# Patient Record
Sex: Male | Born: 1940 | Race: White | Hispanic: No | Marital: Married | Smoking: Never smoker
Health system: Southern US, Community
[De-identification: ages and names within clinical notes are randomized; demographics above are authoritative.]

## PROBLEM LIST (undated history)

## (undated) DIAGNOSIS — I2510 Atherosclerotic heart disease of native coronary artery without angina pectoris: Secondary | ICD-10-CM

## (undated) DIAGNOSIS — Z955 Presence of coronary angioplasty implant and graft: Secondary | ICD-10-CM

## (undated) DIAGNOSIS — I10 Essential (primary) hypertension: Secondary | ICD-10-CM

## (undated) DIAGNOSIS — K5792 Diverticulitis of intestine, part unspecified, without perforation or abscess without bleeding: Secondary | ICD-10-CM

## (undated) HISTORY — PX: ABDOMINAL SURGERY: SHX537

## (undated) HISTORY — PX: JOINT REPLACEMENT: SHX530

## (undated) HISTORY — PX: CHOLECYSTECTOMY: SHX55

## (undated) HISTORY — PX: COLON RESECTION: SHX5231

---

## 2014-04-02 ENCOUNTER — Observation Stay (HOSPITAL_COMMUNITY)
Admission: EM | Admit: 2014-04-02 | Discharge: 2014-04-03 | Disposition: A | Discharge status: Home / Self Care | Payer: Medicare (Managed Care) | Attending: Internal Medicine | Admitting: Internal Medicine

## 2014-04-02 ENCOUNTER — Emergency Department (HOSPITAL_COMMUNITY): Payer: Medicare (Managed Care)

## 2014-04-02 ENCOUNTER — Encounter (HOSPITAL_COMMUNITY): Payer: Self-pay | Admitting: *Deleted

## 2014-04-02 DIAGNOSIS — R079 Chest pain, unspecified: Secondary | ICD-10-CM | POA: Diagnosis present

## 2014-04-02 DIAGNOSIS — R410 Disorientation, unspecified: Secondary | ICD-10-CM | POA: Diagnosis not present

## 2014-04-02 DIAGNOSIS — Z79899 Other long term (current) drug therapy: Secondary | ICD-10-CM | POA: Insufficient documentation

## 2014-04-02 DIAGNOSIS — Z7982 Long term (current) use of aspirin: Secondary | ICD-10-CM | POA: Diagnosis not present

## 2014-04-02 DIAGNOSIS — I2510 Atherosclerotic heart disease of native coronary artery without angina pectoris: Secondary | ICD-10-CM | POA: Insufficient documentation

## 2014-04-02 DIAGNOSIS — Z880 Allergy status to penicillin: Secondary | ICD-10-CM | POA: Insufficient documentation

## 2014-04-02 DIAGNOSIS — Z8719 Personal history of other diseases of the digestive system: Secondary | ICD-10-CM | POA: Diagnosis not present

## 2014-04-02 DIAGNOSIS — I10 Essential (primary) hypertension: Principal | ICD-10-CM | POA: Diagnosis present

## 2014-04-02 DIAGNOSIS — R0789 Other chest pain: Secondary | ICD-10-CM

## 2014-04-02 DIAGNOSIS — Z9861 Coronary angioplasty status: Secondary | ICD-10-CM | POA: Diagnosis not present

## 2014-04-02 HISTORY — DX: Atherosclerotic heart disease of native coronary artery without angina pectoris: I25.10

## 2014-04-02 HISTORY — DX: Presence of coronary angioplasty implant and graft: Z95.5

## 2014-04-02 HISTORY — DX: Diverticulitis of intestine, part unspecified, without perforation or abscess without bleeding: K57.92

## 2014-04-02 HISTORY — DX: Essential (primary) hypertension: I10

## 2014-04-02 LAB — CBC WITH DIFFERENTIAL/PLATELET
BASOS PCT: 0 % (ref 0–1)
Basophils Absolute: 0 10*3/uL (ref 0.0–0.1)
EOS ABS: 0.1 10*3/uL (ref 0.0–0.7)
EOS PCT: 2 % (ref 0–5)
HCT: 46.1 % (ref 39.0–52.0)
HEMOGLOBIN: 15.8 g/dL (ref 13.0–17.0)
Lymphocytes Relative: 23 % (ref 12–46)
Lymphs Abs: 1.2 10*3/uL (ref 0.7–4.0)
MCH: 30.8 pg (ref 26.0–34.0)
MCHC: 34.3 g/dL (ref 30.0–36.0)
MCV: 89.9 fL (ref 78.0–100.0)
MONOS PCT: 12 % (ref 3–12)
Monocytes Absolute: 0.6 10*3/uL (ref 0.1–1.0)
Neutro Abs: 3.3 10*3/uL (ref 1.7–7.7)
Neutrophils Relative %: 63 % (ref 43–77)
Platelets: 152 10*3/uL (ref 150–400)
RBC: 5.13 MIL/uL (ref 4.22–5.81)
RDW: 13 % (ref 11.5–15.5)
WBC: 5.2 10*3/uL (ref 4.0–10.5)

## 2014-04-02 LAB — COMPREHENSIVE METABOLIC PANEL
ALBUMIN: 4 g/dL (ref 3.5–5.2)
ALK PHOS: 62 U/L (ref 39–117)
ALT: 20 U/L (ref 0–53)
AST: 20 U/L (ref 0–37)
Anion gap: 5 (ref 5–15)
BUN: 11 mg/dL (ref 6–23)
CALCIUM: 8.9 mg/dL (ref 8.4–10.5)
CO2: 30 mmol/L (ref 19–32)
Chloride: 103 mmol/L (ref 96–112)
Creatinine, Ser: 0.85 mg/dL (ref 0.50–1.35)
GFR calc Af Amer: >90 mL/min (ref >90)
GFR calc non Af Amer: 84 mL/min — ABNORMAL LOW (ref >90)
Glucose, Bld: 86 mg/dL (ref 70–99)
POTASSIUM: 3.9 mmol/L (ref 3.5–5.1)
Sodium: 138 mmol/L (ref 135–145)
Total Bilirubin: 1.1 mg/dL (ref 0.3–1.2)
Total Protein: 6.9 g/dL (ref 6.0–8.3)

## 2014-04-02 LAB — TROPONIN I

## 2014-04-02 MED ORDER — ATENOLOL 25 MG PO TABS: 25.0000 mg | ORAL_TABLET | Freq: Every day | ORAL | Status: DC

## 2014-04-02 MED ORDER — LOSARTAN POTASSIUM 50 MG PO TABS
50.0000 mg | ORAL_TABLET | Freq: Every day | ORAL | Status: DC
  Administered 2014-04-03: 50 mg via ORAL
  Filled 2014-04-02: qty 1

## 2014-04-02 MED ORDER — PRAVASTATIN SODIUM 40 MG PO TABS: 40.0000 mg | ORAL_TABLET | Freq: Every evening | ORAL | Status: DC

## 2014-04-02 MED ORDER — ATENOLOL 50 MG PO TABS: 50.0000 mg | ORAL_TABLET | Freq: Every day | ORAL | Status: DC

## 2014-04-02 MED ORDER — LOSARTAN POTASSIUM 25 MG PO TABS: 25.0000 mg | ORAL_TABLET | Freq: Every day | ORAL | Status: DC

## 2014-04-02 MED ORDER — ASPIRIN EC 81 MG PO TBEC
81.0000 mg | DELAYED_RELEASE_TABLET | ORAL | Status: DC
  Administered 2014-04-03: 81 mg via ORAL
  Filled 2014-04-02: qty 1

## 2014-04-02 MED ORDER — LOSARTAN POTASSIUM 50 MG PO TABS: 50.0000 mg | ORAL_TABLET | Freq: Every day | ORAL | Status: DC

## 2014-04-02 MED ORDER — NITROGLYCERIN 0.4 MG SL SUBL
0.4000 mg | SUBLINGUAL_TABLET | Freq: Once | SUBLINGUAL | Status: AC
  Administered 2014-04-02: 0.4 mg via SUBLINGUAL
  Filled 2014-04-02: qty 1

## 2014-04-02 MED ORDER — ATENOLOL 25 MG PO TABS
50.0000 mg | ORAL_TABLET | Freq: Every day | ORAL | Status: DC
  Administered 2014-04-03: 50 mg via ORAL
  Filled 2014-04-02: qty 1

## 2014-04-02 NOTE — ED Notes (Signed)
Admitting MD at BS.  

## 2014-04-02 NOTE — ED Notes (Signed)
MD at bedside. 

## 2014-04-02 NOTE — ED Notes (Signed)
Pt in c/o hypertension for the last two weeks, states he was seen at a hospital in [place] for this and his medication was adjusted, pt here for further evaluation

## 2014-04-02 NOTE — ED Provider Notes (Signed)
CSN: [id number]     Arrival date & time 04/02/14  1810 History   First MD Initiated Contact with Patient 04/02/14 2018     Chief Complaint  Patient presents with  . Hypertension     (Consider location/radiation/quality/duration/timing/severity/associated sxs/prior Treatment) Patient is a 74 y.o. male presenting with hypertension. The history is provided by the patient.  Hypertension This is a recurrent problem. Associated symptoms include chest pain. Pertinent negatives include no abdominal pain, no headaches and no shortness of breath.   patient presents for hypertension. Was seen earlier in [place] for the same. They adjusted his valsartan. States pressures are running high. States he feels kind of bad and maybe a little confused with it. States he does have a feeling like reflux and pressure in his chest. He has had a previous coronary artery stent. He does have a history of hypertension. No lightheadedness or dizziness. No headache. No confusion.  Past Medical History  Diagnosis Date  . Hypertension   . Coronary artery disease   . S/P coronary artery stent placement   . Diverticulitis    Past Surgical History  Procedure Laterality Date  . Joint replacement    . Abdominal surgery    . Colon resection    . Cholecystectomy     History reviewed. No pertinent family history. History  Substance Use Topics  . Smoking status: Never Smoker   . Smokeless tobacco: Not on file  . Alcohol Use: Not on file    Review of Systems  Constitutional: Negative for activity change and appetite change.  Eyes: Negative for pain.  Respiratory: Negative for chest tightness and shortness of breath.   Cardiovascular: Positive for chest pain. Negative for leg swelling.  Gastrointestinal: Negative for nausea, vomiting, abdominal pain and diarrhea.  Genitourinary: Negative for flank pain.  Musculoskeletal: Negative for back pain and neck stiffness.  Skin: Negative for rash.  Neurological: Negative  for weakness, numbness and headaches.  Psychiatric/Behavioral: Positive for confusion. Negative for behavioral problems.      Allergies  Eggs or egg-derived products and Penicillins  Home Medications   Prior to Admission medications   Medication Sig Start Date End Date Taking? Authorizing Provider  aspirin EC 81 MG tablet Take 81 mg by mouth every morning.   Yes Historical Provider, MD  atenolol (TENORMIN) 50 MG tablet Take 50 mg by mouth daily.   Yes Historical Provider, MD  EPINEPHrine 0.3 mg/0.3 mL IJ SOAJ injection Inject 0.3 mg into the muscle daily as needed (anaphylaxis).   Yes Historical Provider, MD  Flaxseed, Linseed, (FLAX SEED OIL) 1300 MG CAPS Take 1,300 mg by mouth 2 (two) times daily.   Yes Historical Provider, MD  hydrochlorothiazide (HYDRODIURIL) 25 MG tablet Take 25 mg by mouth daily as needed (fluid).   Yes Historical Provider, MD  ketotifen (ZADITOR) 0.025 % ophthalmic solution Place 1-2 drops into both eyes daily as needed (allergies).   Yes Historical Provider, MD  losartan (COZAAR) 50 MG tablet Take 50 mg by mouth daily.   Yes Historical Provider, MD  Melatonin 3 MG TABS Take 3 mg by mouth at bedtime as needed (sleep).   Yes Historical Provider, MD  Multiple Vitamin (MULTIVITAMIN WITH MINERALS) TABS tablet Take 0.5 tablets by mouth 2 (two) times daily.   Yes Historical Provider, MD  pravastatin (PRAVACHOL) 40 MG tablet Take 40 mg by mouth every evening.   Yes Historical Provider, MD   BP 144/77 mmHg  Pulse 57  Temp(Src) 98.2 F (36.8 C) (Oral)  Resp 19  Ht 5\' 10"  (1.778 m)  Wt 194 lb 4 oz (88.111 kg)  BMI 27.87 kg/m2  SpO2 97% Physical Exam  Constitutional: He appears well-developed.  HENT:  Head: Normocephalic.  Neck: Neck supple.  Cardiovascular: Normal rate.   Pulmonary/Chest: Effort normal.  Abdominal: Soft. There is no tenderness.  Musculoskeletal: Normal range of motion.  Neurological: He is alert.  Skin: Skin is warm.    ED Course   Procedures (including critical care time) Labs Review Labs Reviewed  COMPREHENSIVE METABOLIC PANEL - Abnormal; Notable for the following:    GFR calc non Af Amer 84 (*)    All other components within normal limits  CBC WITH DIFFERENTIAL/PLATELET  TROPONIN I    Imaging Review Dg Chest 2 View  04/02/2014   CLINICAL DATA:  Hypertension and chest pain  EXAM: CHEST  2 VIEW  COMPARISON:  None.  FINDINGS: Cardiac shadow is within normal limits. The lungs are well aerated bilaterally. A vague density is noted in the right lung base best seen on the frontal film. It is not well appreciated on the lateral projection. This may be related confluence of vascular parenchymal shadows or nipple shadow although the possibility of a small nodule cannot be totally excluded. No bony abnormality is noted.  IMPRESSION: Questionable nodular density in the right lung base. This may be artifactual in nature. Short-term followup with nipple markers is recommended   Electronically Signed   By: Alcide CleverMark  Lukens M.D.   On: 04/02/2014 21:57     EKG Interpretation   Date/Time:  Thursday April 02 2014 [id number]:10:[id number]:28:24 EST Ventricular Rate:  56 PR Interval:  69 QRS Duration: 94 QT Interval:  438 QTC Calculation: 423 R Axis:   65 Text Interpretation:  Sinus rhythm Short PR interval Low voltage,  precordial leads Confirmed by Aida Lemaire  MD, Harrold DonathNATHAN [phone]([id number]) on 04/02/2014  8:33:30 PM      MDM   Final diagnoses:  Chest pain, unspecified chest pain type  Essential hypertension    Patient with some chest pressure and hypertension. Both resolved after sublingual nitroglycerin. Has been seen recently for same. Has previous LAD stent. With risk factors of the previous stent and hypertension causing potential pai. Will admit for arms.Juliet Rude,    Alanson Hausmann R. Rubin PayorPickering, MD 04/02/14 [phone]

## 2014-04-02 NOTE — ED Notes (Signed)
Pt requesting to take his home BP medications, consulted with EDP, hold at this time. Pt informed.

## 2014-04-03 ENCOUNTER — Observation Stay (HOSPITAL_COMMUNITY): Payer: Medicare (Managed Care)

## 2014-04-03 DIAGNOSIS — I10 Essential (primary) hypertension: Principal | ICD-10-CM

## 2014-04-03 DIAGNOSIS — R079 Chest pain, unspecified: Secondary | ICD-10-CM

## 2014-04-03 LAB — TROPONIN I
Troponin I: <0.03 ng/mL (ref <0.031)
Troponin I: <0.03 ng/mL (ref <0.031)
Troponin I: <0.03 ng/mL (ref <0.031)

## 2014-04-03 MED ORDER — REGADENOSON 0.4 MG/5ML IV SOLN
0.4000 mg | Freq: Once | INTRAVENOUS | Status: AC
  Administered 2014-04-03: 0.4 mg via INTRAVENOUS

## 2014-04-03 MED ORDER — REGADENOSON 0.4 MG/5ML IV SOLN
INTRAVENOUS | Status: AC
  Filled 2014-04-03: qty 5

## 2014-04-03 MED ORDER — TECHNETIUM TC 99M SESTAMIBI GENERIC - CARDIOLITE
30.0000 | Freq: Once | INTRAVENOUS | Status: AC | PRN
  Administered 2014-04-03: 30 via INTRAVENOUS

## 2014-04-03 MED ORDER — HEPARIN SODIUM (PORCINE) 5000 UNIT/ML IJ SOLN
5000.0000 [IU] | Freq: Three times a day (TID) | INTRAMUSCULAR | Status: DC
  Administered 2014-04-03 (×3): 5000 [IU] via SUBCUTANEOUS
  Filled 2014-04-03 (×3): qty 1

## 2014-04-03 MED ORDER — ONDANSETRON HCL 4 MG/2ML IJ SOLN: 4.0000 mg | Freq: Four times a day (QID) | INTRAMUSCULAR | Status: DC | PRN

## 2014-04-03 MED ORDER — TECHNETIUM TC 99M SESTAMIBI GENERIC - CARDIOLITE
10.0000 | Freq: Once | INTRAVENOUS | Status: AC | PRN
  Administered 2014-04-03: 10 via INTRAVENOUS

## 2014-04-03 MED ORDER — ACETAMINOPHEN 325 MG PO TABS
650.0000 mg | ORAL_TABLET | ORAL | Status: DC | PRN
Start: 2014-04-03 — End: 2014-04-03

## 2014-04-03 NOTE — Consult Note (Signed)
CARDIOLOGY CONSULT NOTE   Patient ID: Michiah Mudry MRN: [id number], DOB/AGE: 03-09-1940   Admit date: 04/02/2014 Date of Consult: 04/03/2014   Primary Physician: No primary care provider on file. Primary Cardiologist: None in [facility]  Pt. Profile  74 year old gentleman with known ischemic heart disease.  Admitted with chest pain  Problem List  Past Medical History  Diagnosis Date  . Hypertension   . Coronary artery disease   . S/P coronary artery stent placement   . Diverticulitis     Past Surgical History  Procedure Laterality Date  . Joint replacement    . Abdominal surgery    . Colon resection    . Cholecystectomy       Allergies  Allergies  Allergen Reactions  . Eggs Or Egg-Derived Products Anaphylaxis    EGG WHITE   . Penicillins Anaphylaxis    HPI   This 74 year old gentleman lives in [place].  He was recently vacationing in [place] and was on his way back home.  He has a son who lives in [place].  He has a history of ischemic heart disease.  2 years ago he had chest pain and had an abnormal Myoview stress test which led to a heart catheterization in [place].  He has a drug-eluting stent to his distal LAD by history.  He was treated with dual antiplatelet therapy with aspirin and Brilinta for one year and now is on aspirin alone.  He has a history of high blood pressure.  Last Sunday his blood pressure was running high and he was having a headache and went to a emergency room in [place] where he was evaluated and released.  He had been on losartan 25 mg daily and he was advised to increase his dose to 50 mg a day.  Yesterday he felt some left-sided chest pressure and felt that his blood pressure was again running high.  He came to [facility] emergency room where he was evaluated.  His initial blood pressure was normal.  His initial EKG showed no ischemic changes.  Cardiac enzymes are negative.  He is having no further chest discomfort. The patient is not  diabetic. The patient is a non smoker Family history reveals that his father died a week after open heart surgery for CABG in his 34s.  His mother died of an head injury following a fall. Inpatient Medications  . aspirin EC  81 mg Oral [id number]  . atenolol  50 mg Oral QHS  . heparin  5,000 Units Subcutaneous 3 times per day  . losartan  50 mg Oral QHS  . pravastatin  40 mg Oral QPM    Family History History reviewed.  Father had CABG in his 39s.  Social History History   Social History  . Marital Status: Married    Spouse Name: N/A  . Number of Children: N/A  . Years of Education: N/A   Occupational History  . Not on file.   Social History Main Topics  . Smoking status: Never Smoker   . Smokeless tobacco: Not on file  . Alcohol Use: Not on file  . Drug Use: Not on file  . Sexual Activity: Not on file   Other Topics Concern  . Not on file   Social History Narrative  . No narrative on file     Review of Systems  General:  No chills, fever, night sweats or weight changes.  Cardiovascular:  No chest pain, dyspnea on exertion, edema, orthopnea, palpitations, paroxysmal nocturnal  dyspnea. Dermatological: No rash, lesions/masses Respiratory: No cough, dyspnea Urologic: No hematuria, dysuria Abdominal:   No nausea, vomiting, diarrhea, bright red blood per rectum, melena, or hematemesis Neurologic:  No visual changes, wkns, changes in mental status. All other systems reviewed and are otherwise negative except as noted above.  Physical Exam  Blood pressure 129/67, pulse 51, temperature 97.8 F (36.6 C), temperature source Oral, resp. rate 18, height 5\' 10"  (1.778 m), weight 192 lb 12.8 oz (87.454 kg), SpO2 96 %.  General: Pleasant, NAD Psych: Normal affect. Neuro: Alert and oriented X 3. Moves all extremities spontaneously. HEENT: Normal  Neck: Supple without bruits or JVD. Lungs:  Resp regular and unlabored, CTA. Heart: RRR no s3, s4, or murmurs. Abdomen: Soft,  non-tender, non-distended, BS + x 4.  Extremities: No clubbing, cyanosis or edema. DP/PT/Radials 2+ and equal bilaterally.  Labs   Recent Labs  04/02/14 2041 04/03/14 0148 04/03/14 0509  TROPONINI <0.03 <0.03 <0.03   Lab Results  Component Value Date   WBC 5.2 04/02/2014   HGB 15.8 04/02/2014   HCT 46.1 04/02/2014   MCV 89.9 04/02/2014   PLT 152 04/02/2014     Recent Labs Lab 04/02/14 2041  NA 138  K 3.9  CL 103  CO2 30  BUN 11  CREATININE 0.85  CALCIUM 8.9  PROT 6.9  BILITOT 1.1  ALKPHOS 62  ALT 20  AST 20  GLUCOSE 86   No results found for: CHOL, HDL, LDLCALC, TRIG No results found for: DDIMER  Radiology/Studies  Dg Chest 2 View  04/02/2014   CLINICAL DATA:  Hypertension and chest pain  EXAM: CHEST  2 VIEW  COMPARISON:  None.  FINDINGS: Cardiac shadow is within normal limits. The lungs are well aerated bilaterally. A vague density is noted in the right lung base best seen on the frontal film. It is not well appreciated on the lateral projection. This may be related confluence of vascular parenchymal shadows or nipple shadow although the possibility of a small nodule cannot be totally excluded. No bony abnormality is noted.  IMPRESSION: Questionable nodular density in the right lung base. This may be artifactual in nature. Short-term followup with nipple markers is recommended   Electronically Signed   By: Alcide CleverMark  Lukens M.D.   On: 04/02/2014 21:57    ECG  Normal sinus rhythm.  No ischemic changes.  Personally reviewed.  ASSESSMENT AND PLAN  1.  Chest pain.  Prior history of stent to LAD 2 years ago. 2.  Essential hypertension without heart failure 3.  History of hypercholesterolemia  Disposition: We will arrange for treadmill Myoview stress test today.  If stress test is negative he should be able to be discharged later today.  Continue on home medications including beta blocker, statin therapy, losartan, and aspirin at discharge.   Signed, Cassell Clementhomas  Amika Tassin, MD  04/03/2014, 8:10 AM

## 2014-04-03 NOTE — Progress Notes (Signed)
UR completed 

## 2014-04-03 NOTE — H&P (Signed)
[facility] History and Physical  Anoop Hemmer EXB:[id number] DOB: Jun 01, 1940 DOA: 04/02/2014  Referring physician: EDP PCP: No primary care provider on file.   Chief Complaint: Chest pain   HPI: Jerry Osborne is a 74 y.o. male who presents to the ED with c/o uncontrollable HTN.  Patient has been in [place] for the past month, his BPs have been running high over the past week or so.  Was seen in [place], had is ARB dosage increased.  He does have a feeling of pressure and "like reflux" in his chest as well.  He has a history of CAD with stent to LAD 2 years ago.  Last stress test was at that time.  Review of Systems: Systems reviewed.  As above, otherwise negative  Past Medical History  Diagnosis Date  . Hypertension   . Coronary artery disease   . S/P coronary artery stent placement   . Diverticulitis    Past Surgical History  Procedure Laterality Date  . Joint replacement    . Abdominal surgery    . Colon resection    . Cholecystectomy     Social History:  reports that he has never smoked. He does not have any smokeless tobacco history on file. His alcohol and drug histories are not on file.  Allergies  Allergen Reactions  . Eggs Or Egg-Derived Products Anaphylaxis    EGG WHITE   . Penicillins Anaphylaxis    History reviewed. No pertinent family history.   Prior to Admission medications   Medication Sig Start Date End Date Taking? Authorizing Provider  aspirin EC 81 MG tablet Take 81 mg by mouth every morning.   Yes Historical Provider, MD  atenolol (TENORMIN) 50 MG tablet Take 50 mg by mouth daily.   Yes Historical Provider, MD  EPINEPHrine 0.3 mg/0.3 mL IJ SOAJ injection Inject 0.3 mg into the muscle daily as needed (anaphylaxis).   Yes Historical Provider, MD  Flaxseed, Linseed, (FLAX SEED OIL) 1300 MG CAPS Take 1,300 mg by mouth 2 (two) times daily.   Yes Historical Provider, MD  hydrochlorothiazide (HYDRODIURIL) 25 MG tablet Take 25 mg by mouth daily as  needed (fluid).   Yes Historical Provider, MD  ketotifen (ZADITOR) 0.025 % ophthalmic solution Place 1-2 drops into both eyes daily as needed (allergies).   Yes Historical Provider, MD  losartan (COZAAR) 50 MG tablet Take 50 mg by mouth daily.   Yes Historical Provider, MD  Melatonin 3 MG TABS Take 3 mg by mouth at bedtime as needed (sleep).   Yes Historical Provider, MD  Multiple Vitamin (MULTIVITAMIN WITH MINERALS) TABS tablet Take 0.5 tablets by mouth 2 (two) times daily.   Yes Historical Provider, MD  pravastatin (PRAVACHOL) 40 MG tablet Take 40 mg by mouth every evening.   Yes Historical Provider, MD   Physical Exam: Filed Vitals:   04/03/14 0015  BP: 137/66  Pulse: 58  Temp:   Resp: 17    BP 137/66 mmHg  Pulse 58  Temp(Src) 98.2 F (36.8 C) (Oral)  Resp 17  Ht  (1.778 m)  Wt 88.111 kg (194 lb 4 oz)  BMI 27.87 kg/m2  SpO2 96%  General Appearance:    Alert, oriented, no distress, appears stated age  Head:    Normocephalic, atraumatic  Eyes:    PERRL, EOMI, sclera non-icteric        Nose:   Nares without drainage or epistaxis. Mucosa, turbinates normal  Throat:   Moist mucous membranes. Oropharynx without erythema or  exudate.  Neck:   Supple. No carotid bruits.  No thyromegaly.  No lymphadenopathy.   Back:     No CVA tenderness, no spinal tenderness  Lungs:     Clear to auscultation bilaterally, without wheezes, rhonchi or rales  Chest wall:    No tenderness to palpitation  Heart:    Regular rate and rhythm without murmurs, gallops, rubs  Abdomen:     Soft, non-tender, nondistended, normal bowel sounds, no organomegaly  Genitalia:    deferred  Rectal:    deferred  Extremities:   No clubbing, cyanosis or edema.  Pulses:   2+ and symmetric all extremities  Skin:   Skin color, texture, turgor normal, no rashes or lesions  Lymph nodes:   Cervical, supraclavicular, and axillary nodes normal  Neurologic:   CNII-XII intact. Normal strength, sensation and reflexes       throughout    Labs on Admission:  Basic Metabolic Panel:  Recent Labs Lab 04/02/14 2041  NA 138  K 3.9  CL 103  CO2 30  GLUCOSE 86  BUN 11  CREATININE 0.85  CALCIUM 8.9   Liver Function Tests:  Recent Labs Lab 04/02/14 2041  AST 20  ALT 20  ALKPHOS 62  BILITOT 1.1  PROT 6.9  ALBUMIN 4.0   No results for input(s): LIPASE, AMYLASE in the last 168 hours. No results for input(s): AMMONIA in the last 168 hours. CBC:  Recent Labs Lab 04/02/14 2041  WBC 5.2  NEUTROABS 3.3  HGB 15.8  HCT 46.1  MCV 89.9  PLT 152   Cardiac Enzymes:  Recent Labs Lab 04/02/14 2041  TROPONINI <0.03    BNP (last 3 results) No results for input(s): PROBNP in the last 8760 hours. CBG: No results for input(s): GLUCAP in the last 168 hours.  Radiological Exams on Admission: Dg Chest 2 View  04/02/2014   CLINICAL DATA:  Hypertension and chest pain  EXAM: CHEST  2 VIEW  COMPARISON:  None.  FINDINGS: Cardiac shadow is within normal limits. The lungs are well aerated bilaterally. A vague density is noted in the right lung base best seen on the frontal film. It is not well appreciated on the lateral projection. This may be related confluence of vascular parenchymal shadows or nipple shadow although the possibility of a small nodule cannot be totally excluded. No bony abnormality is noted.  IMPRESSION: Questionable nodular density in the right lung base. This may be artifactual in nature. Short-term followup with nipple markers is recommended   Electronically Signed   By: Alcide CleverMark  Lukens M.D.   On: 04/02/2014 21:57    EKG: Independently reviewed.  Assessment/Plan Principal Problem:   Chest pain Active Problems:   HTN (hypertension)   1. Chest pain - HEART score = 4 with 2 points for age and 2 points for history of CAD and stenting. 1. Chest pain obs 2. Serial troponins 3. Tele monitor 4. NPO after midnight 2. HTN - continue home meds, BP may elevate tonight because he took half doses  of home meds earlier today for HTN but took none of his night doses, in this case will use hydralazine PRN.    Code Status: Full Code  Family Communication: Wife at bedside Disposition Plan: Admit to obs   Time spent: 70 min  Walterine Amodei M. [facility] Pager [phone]  If 7AM-7PM, please contact the day team taking care of the patient [email or web address] Password [email or web address] 04/03/2014, 12:18 AM

## 2014-04-03 NOTE — ED Notes (Signed)
Attempted to give report 

## 2014-04-03 NOTE — Progress Notes (Signed)
Patient unable to reach target HR of 124 despite walking on treadmill for 9:7518min and went into 4th stage of Bruce Protocol. He became too fatigued to walk further. Test converted to lexiscan stress test  Lexiscan stress test completed without complication. [facility]  Signed, Azalee CourseHao Anjeanette Petzold PA Pager: [phone]

## 2014-04-03 NOTE — Discharge Summary (Signed)
Physician Discharge Summary  Jerry Osborne:[id number] DOB: 29-Jun-1940 DOA: 04/02/2014  PCP: No primary care provider on file.  Admit date: 04/02/2014 Discharge date: 04/03/2014  Time spent: 40 minutes  Recommendations for Outpatient Follow-up:  1. Follow-up with primary care physician within one week  Discharge Diagnoses:  Principal Problem:   Chest pain Active Problems:   HTN (hypertension)   Discharge Condition: Stable  Diet recommendation: Heart Healthy  Filed Weights   04/02/14 1830 04/03/14 0053  Weight: 88.111 kg (194 lb 4 oz) 87.454 kg (192 lb 12.8 oz)    History of present illness:  Jerry Osborne is a 74 y.o. male who presents to the ED with c/o uncontrollable HTN. Patient has been in [place] for the past month, his BPs have been running high over the past week or so. Was seen in [place], had is ARB dosage increased. He does have a feeling of pressure and "like reflux" in his chest as well. He has a history of CAD with stent to LAD 2 years ago. Last stress test was at that time.  Hospital Course:   Chest pain Patient presented to the hospital with pressure-like chest pain. Recently had his hypertension medications change. Cardiology consulted, 3 sets of cardiac enzymes and 12-lead EKG negative for ischemic changes. Exercise stress test was done, patient heart rate did not go up to the target rate so the test switched to [facility] test. Lexiscan as negative for ischemia. Patient discharged home to follow-up with primary care physician. The chest pain is likely gastrointestinal in origin  Essential hypertension Blood pressures controlled, continue home medications.   Procedures:  Lexiscan stress test, showed no evidence of induced ischemia.  Consultations:  Cardiology  Discharge Exam: Filed Vitals:   04/03/14 1216  BP: 147/72  Pulse:   Temp:   Resp:    General: Alert and awake, oriented x3, not in any acute distress. HEENT: anicteric  sclera, pupils reactive to light and accommodation, EOMI CVS: S1-S2 clear, no murmur rubs or gallops Chest: clear to auscultation bilaterally, no wheezing, rales or rhonchi Abdomen: soft nontender, nondistended, normal bowel sounds, no organomegaly Extremities: no cyanosis, clubbing or edema noted bilaterally Neuro: Cranial nerves II-XII intact, no focal neurological deficits  Discharge Instructions   Discharge Instructions    Diet - low sodium heart healthy    Complete by:  As directed      Increase activity slowly    Complete by:  As directed           Current Discharge Medication List    CONTINUE these medications which have NOT CHANGED   Details  aspirin EC 81 MG tablet Take 81 mg by mouth every morning.    atenolol (TENORMIN) 50 MG tablet Take 50 mg by mouth daily.    EPINEPHrine 0.3 mg/0.3 mL IJ SOAJ injection Inject 0.3 mg into the muscle daily as needed (anaphylaxis).    Flaxseed, Linseed, (FLAX SEED OIL) 1300 MG CAPS Take 1,300 mg by mouth 2 (two) times daily.    hydrochlorothiazide (HYDRODIURIL) 25 MG tablet Take 25 mg by mouth daily as needed (fluid).    ketotifen (ZADITOR) 0.025 % ophthalmic solution Place 1-2 drops into both eyes daily as needed (allergies).    losartan (COZAAR) 50 MG tablet Take 50 mg by mouth daily.    Melatonin 3 MG TABS Take 3 mg by mouth at bedtime as needed (sleep).    Multiple Vitamin (MULTIVITAMIN WITH MINERALS) TABS tablet Take 0.5 tablets by mouth 2 (two) times  daily.    pravastatin (PRAVACHOL) 40 MG tablet Take 40 mg by mouth every evening.       Allergies  Allergen Reactions  . Eggs Or Egg-Derived Products Anaphylaxis    EGG WHITE   . Penicillins Anaphylaxis      The results of significant diagnostics from this hospitalization (including imaging, microbiology, ancillary and laboratory) are listed below for reference.    Significant Diagnostic Studies: Dg Chest 2 View  04/02/2014   CLINICAL DATA:  Hypertension and chest  pain  EXAM: CHEST  2 VIEW  COMPARISON:  None.  FINDINGS: Cardiac shadow is within normal limits. The lungs are well aerated bilaterally. A vague density is noted in the right lung base best seen on the frontal film. It is not well appreciated on the lateral projection. This may be related confluence of vascular parenchymal shadows or nipple shadow although the possibility of a small nodule cannot be totally excluded. No bony abnormality is noted.  IMPRESSION: Questionable nodular density in the right lung base. This may be artifactual in nature. Short-term followup with nipple markers is recommended   Electronically Signed   By: Alcide CleverMark  Lukens M.D.   On: 04/02/2014 21:57   Nm Myocar Multi W/spect W/wall Motion / Ef  04/03/2014   CLINICAL DATA:  Chest pain  EXAM: Lexiscan Myovue  TECHNIQUE: The patient received IV Lexiscan .4mg  over 15 seconds. 33.0 mCi of Technetium 5064m Sestamibi injected at 30 seconds. Quantitative SPECT images were obtained in the vertical, horizontal and short axis planes after a 45 minute delay. Rest images were obtained with similar planes and delay using 10.2 mCi of Technetium 7464m Sestamibi.  FINDINGS: ECG:  Rest:  Sinus bradycardia, Stress:  No changes.  Symptoms:  Consistent with Lexiscan injection.  RAW Data: There is diaphragmatic attenuation that is not affecting ability to read.  Quantitative Gated SPECT EF: LVEF 74%, no regional wall motion abnormalities.  Perfusion Images:  There is no stress or rest perfusion defect.  IMPRESSION: 1. Good exercise capacity, the patient exercised on treadmill for 9:18 minutes but the test was converted to pharmacologic due to inability to reach target heart rate.  2. Low risk pharmacologic stress test with normal perfusion at stress and at rest consistent with no ischemia and no scar.  3. LVEF 74 %, no regional wall motion abnormalities.  4. No ECG changes.  5.  Normal BP response to stress.  Tobias AlexanderKatarina Nelson   Electronically Signed   By: Tobias AlexanderKatarina   Nelson   On: 04/03/2014 15:42    Microbiology: No results found for this or any previous visit (from the past 240 hour(s)).   Labs: Basic Metabolic Panel:  Recent Labs Lab 04/02/14 2041  NA 138  K 3.9  CL 103  CO2 30  GLUCOSE 86  BUN 11  CREATININE 0.85  CALCIUM 8.9   Liver Function Tests:  Recent Labs Lab 04/02/14 2041  AST 20  ALT 20  ALKPHOS 62  BILITOT 1.1  PROT 6.9  ALBUMIN 4.0   No results for input(s): LIPASE, AMYLASE in the last 168 hours. No results for input(s): AMMONIA in the last 168 hours. CBC:  Recent Labs Lab 04/02/14 2041  WBC 5.2  NEUTROABS 3.3  HGB 15.8  HCT 46.1  MCV 89.9  PLT 152   Cardiac Enzymes:  Recent Labs Lab 04/02/14 2041 04/03/14 0148 04/03/14 0509 04/03/14 0716  TROPONINI <0.03 <0.03 <0.03 <0.03   BNP: BNP (last 3 results) No results for input(s): BNP in the  last 8760 hours.  ProBNP (last 3 results) No results for input(s): PROBNP in the last 8760 hours.  CBG: No results for input(s): GLUCAP in the last 168 hours.     Signed:  Khaleb Broz A  [facility] 04/03/2014, 4:01 PM
